# Patient Record
Sex: Male | Born: 1985 | Race: White | Hispanic: No | Marital: Single | Smoking: Former smoker
Health system: Southern US, Community
[De-identification: ages and names within clinical notes are randomized; demographics above are authoritative.]

## PROBLEM LIST (undated history)

## (undated) DIAGNOSIS — S2239XA Fracture of one rib, unspecified side, initial encounter for closed fracture: Secondary | ICD-10-CM

## (undated) DIAGNOSIS — S2249XA Multiple fractures of ribs, unspecified side, initial encounter for closed fracture: Secondary | ICD-10-CM

## (undated) HISTORY — DX: Fracture of one rib, unspecified side, initial encounter for closed fracture: S22.39XA

## (undated) HISTORY — DX: Multiple fractures of ribs, unspecified side, initial encounter for closed fracture: S22.49XA

---

## 2011-09-03 ENCOUNTER — Ambulatory Visit (HOSPITAL_COMMUNITY)
Admission: RE | Admit: 2011-09-03 | Discharge: 2011-09-03 | Disposition: A | Discharge status: Home / Self Care | Payer: No Typology Code available for payment source | Source: Ambulatory Visit | Attending: Emergency Medicine | Admitting: Emergency Medicine

## 2011-09-03 ENCOUNTER — Other Ambulatory Visit: Payer: Self-pay | Admitting: Emergency Medicine

## 2011-09-03 ENCOUNTER — Other Ambulatory Visit: Payer: Self-pay

## 2011-09-03 DIAGNOSIS — R52 Pain, unspecified: Secondary | ICD-10-CM

## 2011-09-03 DIAGNOSIS — M542 Cervicalgia: Secondary | ICD-10-CM | POA: Insufficient documentation

## 2011-09-03 DIAGNOSIS — Q7649 Other congenital malformations of spine, not associated with scoliosis: Secondary | ICD-10-CM | POA: Insufficient documentation

## 2011-09-07 ENCOUNTER — Other Ambulatory Visit (HOSPITAL_COMMUNITY): Payer: Self-pay
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
## 2018-07-18 ENCOUNTER — Emergency Department (HOSPITAL_COMMUNITY): Payer: BLUE CROSS/BLUE SHIELD

## 2018-07-18 ENCOUNTER — Encounter (HOSPITAL_COMMUNITY): Payer: Self-pay | Admitting: Emergency Medicine

## 2018-07-18 ENCOUNTER — Emergency Department (HOSPITAL_COMMUNITY)
Admission: EM | Admit: 2018-07-18 | Discharge: 2018-07-19 | Disposition: A | Discharge status: Home / Self Care | Payer: BLUE CROSS/BLUE SHIELD | Attending: Emergency Medicine | Admitting: Emergency Medicine

## 2018-07-18 ENCOUNTER — Other Ambulatory Visit: Payer: Self-pay

## 2018-07-18 DIAGNOSIS — S0993XA Unspecified injury of face, initial encounter: Secondary | ICD-10-CM | POA: Diagnosis present

## 2018-07-18 DIAGNOSIS — Y92410 Unspecified street and highway as the place of occurrence of the external cause: Secondary | ICD-10-CM | POA: Diagnosis not present

## 2018-07-18 DIAGNOSIS — S30811A Abrasion of abdominal wall, initial encounter: Secondary | ICD-10-CM | POA: Diagnosis not present

## 2018-07-18 DIAGNOSIS — Z23 Encounter for immunization: Secondary | ICD-10-CM | POA: Insufficient documentation

## 2018-07-18 DIAGNOSIS — Y9389 Activity, other specified: Secondary | ICD-10-CM | POA: Diagnosis not present

## 2018-07-18 DIAGNOSIS — Y999 Unspecified external cause status: Secondary | ICD-10-CM | POA: Insufficient documentation

## 2018-07-18 DIAGNOSIS — M545 Low back pain: Secondary | ICD-10-CM | POA: Diagnosis not present

## 2018-07-18 DIAGNOSIS — R55 Syncope and collapse: Secondary | ICD-10-CM | POA: Insufficient documentation

## 2018-07-18 DIAGNOSIS — S0990XA Unspecified injury of head, initial encounter: Secondary | ICD-10-CM

## 2018-07-18 DIAGNOSIS — S0083XA Contusion of other part of head, initial encounter: Secondary | ICD-10-CM | POA: Insufficient documentation

## 2018-07-18 DIAGNOSIS — R51 Headache: Secondary | ICD-10-CM | POA: Insufficient documentation

## 2018-07-18 DIAGNOSIS — T148XXA Other injury of unspecified body region, initial encounter: Secondary | ICD-10-CM

## 2018-07-18 MED ORDER — NAPROXEN 500 MG PO TABS
500.0000 mg | ORAL_TABLET | Freq: Once | ORAL | Status: AC
  Administered 2018-07-19: 500 mg via ORAL
  Filled 2018-07-18: qty 1

## 2018-07-18 MED ORDER — OXYCODONE-ACETAMINOPHEN 5-325 MG PO TABS
1.0000 | ORAL_TABLET | ORAL | Status: DC | PRN
  Administered 2018-07-18: 1 via ORAL
  Filled 2018-07-18: qty 1

## 2018-07-18 MED ORDER — TETANUS-DIPHTH-ACELL PERTUSSIS 5-2.5-18.5 LF-MCG/0.5 IM SUSP
0.5000 mL | Freq: Once | INTRAMUSCULAR | Status: AC
Start: 2018-07-19 — End: 2018-07-19
  Administered 2018-07-19: 0.5 mL via INTRAMUSCULAR
  Filled 2018-07-18: qty 0.5
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
## 2018-07-18 NOTE — ED Notes (Signed)
Consulted with Dr. Silverio LayYao regarding further pt care

## 2018-07-18 NOTE — ED Notes (Signed)
Bed: WA05 Expected date:  Expected time:  Means of arrival:  Comments: 

## 2018-07-19 ENCOUNTER — Emergency Department (HOSPITAL_COMMUNITY): Payer: BLUE CROSS/BLUE SHIELD

## 2018-07-19 MED ORDER — BACITRACIN ZINC 500 UNIT/GM EX OINT
TOPICAL_OINTMENT | Freq: Every day | CUTANEOUS | Status: DC
  Filled 2018-07-19: qty 0.9

## 2018-07-19 MED ORDER — NAPROXEN 375 MG PO TABS: 375.0000 mg | ORAL_TABLET | Freq: Two times a day (BID) | ORAL | 0 refills | Status: DC

## 2018-07-19 MED ORDER — CYCLOBENZAPRINE HCL 10 MG PO TABS: 10.0000 mg | ORAL_TABLET | Freq: Two times a day (BID) | ORAL | 0 refills | Status: DC | PRN
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
## 2018-07-19 NOTE — ED Notes (Signed)
PATIENT AMBULATED DOWN THE HALL WITH NO ISSUES.

## 2018-07-19 NOTE — Discharge Instructions (Addendum)
You have been diagnosed with a concussion.  Please take the Naproxen as prescribed for pain. Do not take any additional NSAIDs including Motrin, Aleve, Ibuprofen, Advil. Tylenol for pain Rest, ice on head.  Stay in a quiet, non-simulating, dark environment. No TV, computer use, video games until headache is resolved completely. Follow up with your primary care physician if headache persists.  Return to the emergency department if patient becomes lethargic, begins vomiting or other change in mental status.  HEAD INJURY If any of the following occur notify your physician or go to the Hospital Emergency Department:  Increased drowsiness, stupor or loss of consciousness  Temperature above 100 F  Vomiting  Severe headache  Blood or clear fluid dripping from the nose or ears  Stiffness of the neck  Dizziness or blurred vision  Any other unusual symptoms  PRECAUTIONS  Keep head elevated at all times for the first 24 hours (Elevate mattress if pillow is ineffective)  Do not take sedatives, narcotics or alcohol  Avoid aspirin. Use only acetaminophen (e.g. Tylenol) or ibuprofen (e.g. Advil) for relief of pain. Follow directions on the bottle for dosage.  Use ice packs for comfort

## 2018-07-19 NOTE — ED Notes (Signed)
Pt wound dressed and cleaned

## 2018-07-24 ENCOUNTER — Encounter: Payer: Self-pay | Admitting: Family Medicine

## 2018-07-24 ENCOUNTER — Ambulatory Visit: Payer: BLUE CROSS/BLUE SHIELD | Admitting: Family Medicine

## 2018-07-24 VITALS — BP 120/78 | HR 83 | Temp 97.9°F | Ht 70.0 in | Wt 237.8 lb

## 2018-07-24 DIAGNOSIS — F17200 Nicotine dependence, unspecified, uncomplicated: Secondary | ICD-10-CM

## 2018-07-24 DIAGNOSIS — Z789 Other specified health status: Secondary | ICD-10-CM

## 2018-07-24 DIAGNOSIS — Z13220 Encounter for screening for lipoid disorders: Secondary | ICD-10-CM

## 2018-07-24 DIAGNOSIS — E348 Other specified endocrine disorders: Secondary | ICD-10-CM | POA: Diagnosis not present

## 2018-07-24 DIAGNOSIS — Z8249 Family history of ischemic heart disease and other diseases of the circulatory system: Secondary | ICD-10-CM | POA: Diagnosis not present

## 2018-07-24 DIAGNOSIS — Z7289 Other problems related to lifestyle: Secondary | ICD-10-CM

## 2018-07-24 LAB — COMPREHENSIVE METABOLIC PANEL
ALBUMIN: 4.6 g/dL (ref 3.5–5.2)
ALT: 35 U/L (ref 0–53)
AST: 24 U/L (ref 0–37)
Alkaline Phosphatase: 71 U/L (ref 39–117)
BUN: 21 mg/dL (ref 6–23)
CHLORIDE: 102 meq/L (ref 96–112)
CO2: 30 mEq/L (ref 19–32)
CREATININE: 1.07 mg/dL (ref 0.40–1.50)
Calcium: 9.7 mg/dL (ref 8.4–10.5)
GFR: 86.31 mL/min (ref >60.00)
GLUCOSE: 92 mg/dL (ref 70–99)
Potassium: 4.6 mEq/L (ref 3.5–5.1)
SODIUM: 138 meq/L (ref 135–145)
TOTAL PROTEIN: 6.8 g/dL (ref 6.0–8.3)
Total Bilirubin: 0.8 mg/dL (ref 0.2–1.2)

## 2018-07-24 LAB — LIPID PANEL
CHOL/HDL RATIO: 5
Cholesterol: 231 mg/dL — ABNORMAL HIGH (ref 0–200)
HDL: 42.3 mg/dL (ref >39.00)
NONHDL: 188.39
Triglycerides: 208 mg/dL — ABNORMAL HIGH (ref 0.0–149.0)
VLDL: 41.6 mg/dL — ABNORMAL HIGH (ref 0.0–40.0)

## 2018-07-24 LAB — LDL CHOLESTEROL, DIRECT: LDL DIRECT: 156 mg/dL
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
## 2018-08-07 ENCOUNTER — Ambulatory Visit
Admission: RE | Admit: 2018-08-07 | Discharge: 2018-08-07 | Disposition: A | Discharge status: Home / Self Care | Payer: BLUE CROSS/BLUE SHIELD | Source: Ambulatory Visit | Attending: Family Medicine | Admitting: Family Medicine

## 2018-08-07 DIAGNOSIS — E348 Other specified endocrine disorders: Secondary | ICD-10-CM

## 2018-08-23 ENCOUNTER — Ambulatory Visit
Admission: RE | Admit: 2018-08-23 | Discharge: 2018-08-23 | Disposition: A | Discharge status: Home / Self Care | Payer: BLUE CROSS/BLUE SHIELD | Source: Ambulatory Visit | Attending: Family Medicine | Admitting: Family Medicine

## 2018-08-23 MED ORDER — GADOBENATE DIMEGLUMINE 529 MG/ML IV SOLN
18.0000 mL | Freq: Once | INTRAVENOUS | Status: AC | PRN
  Administered 2018-08-23: 18 mL via INTRAVENOUS

## 2018-08-25 ENCOUNTER — Other Ambulatory Visit: Payer: Self-pay | Admitting: Family Medicine

## 2018-08-25 DIAGNOSIS — E348 Other specified endocrine disorders: Secondary | ICD-10-CM

## 2018-08-25 NOTE — Progress Notes (Signed)
Called and spoke with pt and he is aware of results.  Order placed for MRi w/o contrast for next sept 2020.  Printed out copy of order and given to [place] to place in folder.

## 2019-10-11 ENCOUNTER — Encounter: Payer: Self-pay | Admitting: Family Medicine

## 2019-10-11 ENCOUNTER — Other Ambulatory Visit: Payer: Self-pay

## 2019-10-11 ENCOUNTER — Telehealth (INDEPENDENT_AMBULATORY_CARE_PROVIDER_SITE_OTHER): Payer: HRSA Program | Admitting: Family Medicine

## 2019-10-11 VITALS — Temp 100.6°F

## 2019-10-11 DIAGNOSIS — R05 Cough: Secondary | ICD-10-CM | POA: Diagnosis not present

## 2019-10-11 DIAGNOSIS — R197 Diarrhea, unspecified: Secondary | ICD-10-CM

## 2019-10-11 DIAGNOSIS — Z20822 Contact with and (suspected) exposure to covid-19: Secondary | ICD-10-CM

## 2019-10-11 DIAGNOSIS — R059 Cough, unspecified: Secondary | ICD-10-CM

## 2019-10-11 DIAGNOSIS — Z20828 Contact with and (suspected) exposure to other viral communicable diseases: Secondary | ICD-10-CM | POA: Diagnosis not present

## 2019-10-11 NOTE — Patient Instructions (Addendum)
Follow up: as needed if any questions concerns, worsening or not improving as expected  -can use tylenol or aleve as needed per instructions -plenty of fluids and rest -seek prompt medical care at the urgent care or the emergency room/hospital if worsening, difficulty breathing, chest pain, feeling very tired, severe headaches or other concerns or severe symptoms.  Self Isolation/Home Quarantine: -see the CDC site for information:   DiscoHelp.si.html   -STAY HOME except for to seek medical care -stay in your own room away from others in your house and use a separate bathroom if possible -Wash hands frequently, disinfect high touch surface areas often, wear a mask if you leave your room and interact as little as possible with others -seek medical care immediately if worsening - call our office for a visit or call ahead if going elsewhere to an urgent care  -seek emergency care if very sick or severe symptoms - call 911 -isolate for at least 10 days from the onset of symptoms PLUS 1 days of no fever PLUS 1 days of improving symptoms  We recommend wearing a mask, social distancing, good hand hygiene and asking others  around you to do the same at all times when around others outside of your home unit throughout the COVID19 pandemic.    Novel Coronavirus Testing: I sent an order for coronavirus testing. No Appointment is needed.  Testing Sites:    [facility] Location:                            [address], [place]                                              Camp[facility] ([facility]) Hours:                                 8a-3:45p, M-F  [facility] Location:                           [address] [place], [place] [address]                                                              [address] Building [facility]) Hours:                                 8a-3:45p,  M-F  [facility] Location:                            Medical illustrator (across from [facility] ED/[place]) Hours:                                 8a-3:45p, M-F  Positive test. These tests are not 100% perfect, but if you tested positive for COVID-19, this confirms that you have contracted the SARS-CoV-2 virus. STAY HOME  to complete full Quarantine per CDC guidelines.  Negative test. These tests are not 100% perfect but if you tested negative for COVID-19, this indicates that you may not have contracted the SARS-CoV-2 virus. Follow your doctor's recommendations and the CDC guidelines.    WORK SLIP:  Please excuse patient Preston Leon,  05-22-1986, from work according to the [facility] guidelines for a COVID like illness. We advise 10 days minimum from the onset of symptoms (10/09/2019) PLUS 1 days of no fever PLUS 1 days of feeling better.  She/He may work remotely from home in self isolation if she is feeling better and wishes to do so.  Sincerely: E-signature: Dr. Colin Benton, DO [place] Ph: [phone]

## 2019-10-11 NOTE — Progress Notes (Signed)
Virtual Visit via Video Note  I connected with Preston Leon  on 10/11/19 at 12:00 PM EDT by a video enabled telemedicine application and verified that I am speaking with the correct person using two identifiers.  Location patient: home Location provider:work or home office Persons participating in the virtual visit: patient, provider  I discussed the limitations of evaluation and management by telemedicine and the availability of in person appointments. The patient expressed understanding and agreed to proceed.   HPI: Acute visit for concerns for COVID19. Started two days ago. Symptoms include fever - 102 this morning, HA, facial pain, sweating, diarrhea the last 2 days, bodyaches, cough, congestion, sore throat. Took tylenol and fever came down to 99 now. Works in [employer], he has been around customers that did not feel well - but double masking always at work. Has been to some gathering and around his friends sometimes without masks. Denies SOB, CP, inability to tolerate fluids or PO intake, neuro deficits. No tick bites, travel or different foods recently. No known sick contacts. No high risk medical conditions. Lives alone.  ROS: See pertinent positives and negatives per HPI.  Past Medical History:  Diagnosis Date  . Rib fractures    playing lacrosse    No past surgical history on file.  Family History  Problem Relation Age of Onset  . Obesity Mother   . Coronary artery disease Father   . Other Maternal Grandfather   . Coronary artery disease Paternal Grandmother   . Alzheimer's disease Paternal Grandfather     SOCIAL HX: see hpi  No current outpatient medications on file.  EXAM:  VITALS per patient if applicable: Temp 99  GENERAL: alert, oriented, appears well and in no acute distress  HEENT: atraumatic, conjunttiva clear, no obvious abnormalities on inspection of external nose and ears  NECK: normal movements of the head and neck  LUNGS: on inspection no signs of  respiratory distress, breathing rate appears normal, no obvious gross SOB, gasping or wheezing  CV: no obvious cyanosis  MS: moves all visible extremities without noticeable abnormality  PSYCH/NEURO: pleasant and cooperative, no obvious depression or anxiety, speech and thought processing grossly intact  ASSESSMENT AND PLAN:  Discussed the following assessment and plan:  Suspected 2019 novel coronavirus infection  Cough  Diarrhea, unspecified type  -we discussed possible serious and likely etiologies, options for evaluation and workup, limitations of telemedicine visit vs in person visit, treatment, treatment risks and precautions. Pt prefers to treat via telemedicine empirically rather then risking or undertaking an in person visit at this moment. Possible COVID19 vs influenza vs viral illness vs other. Patient desires COVID testing - discussed options and limitations. Agrees to self isolation regardless of test results. Symptomatic home care. UCC/ER return precautions. Offered follow up visit tomorrow - he prefers to call if feels needs follow up.  Patient agrees to seek prompt in person care if worsening, new symptoms arise, or if is not improving with treatment.  -advised masking, social distancing and hand hygiene always throughout the pandemic once well again I discussed the assessment and treatment plan with the patient. The patient was provided an opportunity to ask questions and all were answered. The patient agreed with the plan and demonstrated an understanding of the instructions.   The patient was advised to call back or seek an in-person evaluation if the symptoms worsen or if the condition fails to improve as anticipated.   Terressa KoyanagiHannah R Lanell Dubie, DO   Patient Instructions  Follow up: as needed  if any questions concerns, worsening or not improving as expected  -can use tylenol or aleve as needed per instructions -plenty of fluids and rest -seek prompt medical care at the urgent  care or the emergency room/hospital if worsening, difficulty breathing, chest pain, feeling very tired, severe headaches or other concerns or severe symptoms.  Self Isolation/Home Quarantine: -see the CDC site for information:   DiscoHelp.si.html   -STAY HOME except for to seek medical care -stay in your own room away from others in your house and use a separate bathroom if possible -Wash hands frequently, disinfect high touch surface areas often, wear a mask if you leave your room and interact as little as possible with others -seek medical care immediately if worsening - call our office for a visit or call ahead if going elsewhere to an urgent care  -seek emergency care if very sick or severe symptoms - call 911 -isolate for at least 10 days from the onset of symptoms PLUS 1 days of no fever PLUS 1 days of improving symptoms  We recommend wearing a mask, social distancing, good hand hygiene and asking others  around you to do the same at all times when around others outside of your home unit throughout the COVID19 pandemic.    Novel Coronavirus Testing: I sent an order for coronavirus testing. No Appointment is needed.  Testing Sites:    [facility] Location:                            [address], [place]                                              Campus (old [facility]) Hours:                                 8a-3:45p, M-F  [facility] Location:                           [address] [place], [place] [address]                                                              [address] Building [facility]) Hours:                                 8a-3:45p, M-F  [facility] Location:                            Medical illustrator (across from [facility] ED/[place]) Hours:                                 8a-3:45p, M-F  Positive test. These tests are not 100% perfect, but if you tested  positive for COVID-19, this confirms that you have contracted the SARS-CoV-2 virus. STAY HOME to complete full  Quarantine per [employer] guidelines.  Negative test. These tests are not 100% perfect but if you tested negative for COVID-19, this indicates that you may not have contracted the SARS-CoV-2 virus. Follow your doctor's recommendations and the CDC guidelines.    WORK SLIP:  Please excuse patient Preston Leon,  1986-12-17, from work according to the [facility] guidelines for a COVID like illness. We advise 10 days minimum from the onset of symptoms (10/09/2019) PLUS 1 days of no fever PLUS 1 days of feeling better.  She/He may work remotely from home in self isolation if she is feeling better and wishes to do so.  Sincerely: E-signature: Dr. Colin Benton, DO [place] Ph: [phone]

## 2019-10-13 LAB — NOVEL CORONAVIRUS, NAA: SARS-CoV-2, NAA: NOT DETECTED

## 2020-06-28 ENCOUNTER — Encounter: Payer: Self-pay | Admitting: Family Medicine
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
## 2020-08-18 ENCOUNTER — Ambulatory Visit: Payer: BC Managed Care – PPO | Admitting: Family Medicine

## 2020-08-18 ENCOUNTER — Other Ambulatory Visit: Payer: Self-pay

## 2020-08-18 ENCOUNTER — Encounter: Payer: Self-pay | Admitting: Family Medicine

## 2020-08-18 VITALS — BP 122/60 | HR 69 | Temp 98.1°F | Wt 244.4 lb

## 2020-08-18 DIAGNOSIS — J029 Acute pharyngitis, unspecified: Secondary | ICD-10-CM | POA: Diagnosis not present

## 2020-08-18 LAB — POCT RAPID STREP A (OFFICE): Rapid Strep A Screen: NEGATIVE

## 2020-08-18 MED ORDER — CEPHALEXIN 500 MG PO CAPS: 500.0000 mg | ORAL_CAPSULE | Freq: Three times a day (TID) | ORAL | 0 refills | Status: AC

## 2020-08-18 NOTE — Progress Notes (Signed)
   Subjective:    Patient ID: Preston Leon, male    DOB: 1986-03-13, 34 y.o.   MRN: [id number]  HPI Here for 4 days of a bad ST. No fever or headache or body aches or GI symptoms. Taking Tylenol and drinking fluids.   Review of Systems  Constitutional: Negative.   HENT: Positive for sore throat and trouble swallowing. Negative for congestion, facial swelling, postnasal drip, sinus pressure and voice change.   Eyes: Negative.   Respiratory: Negative.        Objective:   Physical Exam Constitutional:      Appearance: Normal appearance. He is well-developed. He is not ill-appearing.  HENT:     Right Ear: Tympanic membrane, ear canal and external ear normal.     Left Ear: Tympanic membrane, ear canal and external ear normal.     Nose: Nose normal.     Mouth/Throat:     Comments: Posterior OP is red without exudate. His breath has a foul odor  Eyes:     Conjunctiva/sclera: Conjunctivae normal.  Pulmonary:     Effort: Pulmonary effort is normal.     Breath sounds: Normal breath sounds.  Lymphadenopathy:     Cervical: Cervical adenopathy present.  Neurological:     Mental Status: He is alert.           Assessment & Plan:  Pharyngitis, likely streptococcal. Treat with Keflex.  Gershon Crane, MD
